# Patient Record
Sex: Female | Born: 1965 | Hispanic: Yes | Marital: Married
Health system: Southern US, Community
[De-identification: ages and names within clinical notes are randomized; demographics above are authoritative.]

## PROBLEM LIST (undated history)

## (undated) DIAGNOSIS — R569 Unspecified convulsions: Secondary | ICD-10-CM

## (undated) HISTORY — DX: Unspecified convulsions: R56.9

## (undated) HISTORY — PX: BREAST BIOPSY: SHX20

## (undated) HISTORY — PX: BRAIN SURGERY: SHX531

---

## 2008-03-30 ENCOUNTER — Ambulatory Visit: Payer: Self-pay

## 2010-01-11 ENCOUNTER — Ambulatory Visit: Payer: Self-pay | Admitting: Family Medicine

## 2010-01-25 ENCOUNTER — Ambulatory Visit: Payer: Self-pay | Admitting: Family Medicine

## 2010-02-01 ENCOUNTER — Ambulatory Visit: Payer: Self-pay

## 2010-08-10 ENCOUNTER — Ambulatory Visit: Payer: Self-pay

## 2011-01-17 ENCOUNTER — Ambulatory Visit: Payer: Self-pay

## 2011-02-22 ENCOUNTER — Ambulatory Visit: Payer: Self-pay

## 2012-10-29 ENCOUNTER — Ambulatory Visit: Payer: Self-pay

## 2014-05-31 ENCOUNTER — Ambulatory Visit: Payer: Self-pay

## 2014-08-09 ENCOUNTER — Emergency Department: Payer: Self-pay | Admitting: Emergency Medicine

## 2018-04-23 ENCOUNTER — Ambulatory Visit: Payer: Self-pay

## 2018-07-23 ENCOUNTER — Ambulatory Visit: Payer: Self-pay | Attending: Oncology

## 2018-07-23 ENCOUNTER — Other Ambulatory Visit: Payer: Self-pay

## 2018-07-23 ENCOUNTER — Ambulatory Visit
Admission: RE | Admit: 2018-07-23 | Discharge: 2018-07-23 | Disposition: A | Discharge status: Home / Self Care | Payer: Self-pay | Source: Ambulatory Visit | Attending: Oncology | Admitting: Oncology

## 2018-07-23 ENCOUNTER — Encounter (INDEPENDENT_AMBULATORY_CARE_PROVIDER_SITE_OTHER): Payer: Self-pay

## 2018-07-23 VITALS — BP 147/87 | HR 73 | Temp 98.2°F | Ht <= 58 in | Wt 137.0 lb

## 2018-07-23 DIAGNOSIS — Z0000 Encounter for general adult medical examination without abnormal findings: Secondary | ICD-10-CM | POA: Insufficient documentation

## 2018-07-23 NOTE — Progress Notes (Signed)
  Subjective:     Patient ID: Lindsay Dunlap, female   DOB: [birth year], 53 y.o.   MRN: [id number]  HPI   Review of Systems     Objective:   Physical Exam Chest:     Breasts:        Right: No swelling, bleeding, inverted nipple, mass, nipple discharge, skin change or tenderness.        Left: No swelling, bleeding, inverted nipple, mass, nipple discharge, skin change or tenderness.        Assessment:     53 year old patient presents for [facility] clinic visit. Lindsay Dunlap interpreted exam.  Patient screened, and meets B[facility]CP eligibility.  Patient does not have insurance, Medicare or Medicaid.  Handout given on Affordable Care Act.  Instructed patient on breast self awareness using teach back method.  Clinical breast exam unremarkable.  No mass or lump palpated.  Patient works at [employer].  She is experiencing bilateral numbness and tingling in her hands.  She is following up with her primary provider after her mammogram today.    Plan:     Sent for bilateral screening mammogram.

## 2018-07-30 NOTE — Progress Notes (Signed)
Letter mailed from [facility] to notify of normal mammogram results.  Patient to return in one year for annual screening.  Copy to [facility]. 

## 2020-01-31 ENCOUNTER — Emergency Department
Admission: EM | Admit: 2020-01-31 | Discharge: 2020-02-01 | Disposition: A | Discharge status: Other Acute Inpatient Hospital | Payer: Self-pay | Attending: Emergency Medicine | Admitting: Emergency Medicine

## 2020-01-31 ENCOUNTER — Emergency Department: Payer: Self-pay

## 2020-01-31 ENCOUNTER — Other Ambulatory Visit: Payer: Self-pay

## 2020-01-31 ENCOUNTER — Ambulatory Visit (HOSPITAL_COMMUNITY)
Admission: AD | Admit: 2020-01-31 | Discharge: 2020-01-31 | Disposition: A | Discharge status: Home / Self Care | Payer: Self-pay | Source: Other Acute Inpatient Hospital | Attending: Emergency Medicine | Admitting: Emergency Medicine

## 2020-01-31 ENCOUNTER — Encounter: Payer: Self-pay | Admitting: Emergency Medicine

## 2020-01-31 DIAGNOSIS — G40901 Epilepsy, unspecified, not intractable, with status epilepticus: Secondary | ICD-10-CM | POA: Insufficient documentation

## 2020-01-31 DIAGNOSIS — Z20822 Contact with and (suspected) exposure to covid-19: Secondary | ICD-10-CM | POA: Insufficient documentation

## 2020-01-31 LAB — COMPREHENSIVE METABOLIC PANEL
ALT: 44 U/L (ref 0–44)
AST: 77 U/L — ABNORMAL HIGH (ref 15–41)
Albumin: 3.9 g/dL (ref 3.5–5.0)
Alkaline Phosphatase: 317 U/L — ABNORMAL HIGH (ref 38–126)
Anion gap: 11 (ref 5–15)
BUN: 10 mg/dL (ref 6–20)
CO2: 26 mmol/L (ref 22–32)
Calcium: 8.8 mg/dL — ABNORMAL LOW (ref 8.9–10.3)
Chloride: 102 mmol/L (ref 98–111)
Creatinine, Ser: 0.58 mg/dL (ref 0.44–1.00)
GFR calc Af Amer: >60 mL/min (ref >60)
GFR calc non Af Amer: >60 mL/min (ref >60)
Glucose, Bld: 144 mg/dL — ABNORMAL HIGH (ref 70–99)
Potassium: 4.1 mmol/L (ref 3.5–5.1)
Sodium: 139 mmol/L (ref 135–145)
Total Bilirubin: 0.6 mg/dL (ref 0.3–1.2)
Total Protein: 8.4 g/dL — ABNORMAL HIGH (ref 6.5–8.1)

## 2020-01-31 LAB — CBC WITH DIFFERENTIAL/PLATELET
Abs Immature Granulocytes: 0.06 10*3/uL (ref 0.00–0.07)
Basophils Absolute: 0.1 10*3/uL (ref 0.0–0.1)
Basophils Relative: 1 %
Eosinophils Absolute: 0 10*3/uL (ref 0.0–0.5)
Eosinophils Relative: 0 %
HCT: 39.4 % (ref 36.0–46.0)
Hemoglobin: 12.9 g/dL (ref 12.0–15.0)
Immature Granulocytes: 0 %
Lymphocytes Relative: 9 %
Lymphs Abs: 1.3 10*3/uL (ref 0.7–4.0)
MCH: 26.2 pg (ref 26.0–34.0)
MCHC: 32.7 g/dL (ref 30.0–36.0)
MCV: 79.9 fL — ABNORMAL LOW (ref 80.0–100.0)
Monocytes Absolute: 0.7 10*3/uL (ref 0.1–1.0)
Monocytes Relative: 5 %
Neutro Abs: 12.3 10*3/uL — ABNORMAL HIGH (ref 1.7–7.7)
Neutrophils Relative %: 85 %
Platelets: 320 10*3/uL (ref 150–400)
RBC: 4.93 MIL/uL (ref 3.87–5.11)
RDW: 16 % — ABNORMAL HIGH (ref 11.5–15.5)
WBC: 14.6 10*3/uL — ABNORMAL HIGH (ref 4.0–10.5)
nRBC: 0 % (ref 0.0–0.2)

## 2020-01-31 LAB — BLOOD GAS, VENOUS
Acid-base deficit: 2.7 mmol/L — ABNORMAL HIGH (ref 0.0–2.0)
Bicarbonate: 22.6 mmol/L (ref 20.0–28.0)
FIO2: 0.28
MECHVT: 450 mL
Mechanical Rate: 16
O2 Saturation: 93.3 %
PEEP: 5 cmH2O
Patient temperature: 37
RATE: 16 resp/min
pCO2, Ven: 40 mmHg — ABNORMAL LOW (ref 44.0–60.0)
pH, Ven: 7.36 (ref 7.250–7.430)
pO2, Ven: 71 mmHg — ABNORMAL HIGH (ref 32.0–45.0)

## 2020-01-31 LAB — BLOOD GAS, ARTERIAL
Acid-Base Excess: 0.4 mmol/L (ref 0.0–2.0)
Bicarbonate: 26 mmol/L (ref 20.0–28.0)
FIO2: 0.28
MECHVT: 450 mL
O2 Saturation: 94.8 %
PEEP: 5 cmH2O
Patient temperature: 37
RATE: 16 resp/min
pCO2 arterial: 45 mmHg (ref 32.0–48.0)
pH, Arterial: 7.37 (ref 7.350–7.450)
pO2, Arterial: 77 mmHg — ABNORMAL LOW (ref 83.0–108.0)

## 2020-01-31 LAB — URINALYSIS, COMPLETE (UACMP) WITH MICROSCOPIC
Bilirubin Urine: NEGATIVE
Glucose, UA: NEGATIVE mg/dL
Hgb urine dipstick: NEGATIVE
Ketones, ur: NEGATIVE mg/dL
Leukocytes,Ua: NEGATIVE
Nitrite: POSITIVE — AB
Protein, ur: 100 mg/dL — AB
Specific Gravity, Urine: 1.023 (ref 1.005–1.030)
pH: 6 (ref 5.0–8.0)

## 2020-01-31 LAB — PREGNANCY, URINE: Preg Test, Ur: NEGATIVE

## 2020-01-31 LAB — SARS CORONAVIRUS 2 BY RT PCR (HOSPITAL ORDER, PERFORMED IN ~~LOC~~ HOSPITAL LAB): SARS Coronavirus 2: NEGATIVE

## 2020-01-31 MED ORDER — NOREPINEPHRINE 4 MG/250ML-% IV SOLN
0.0000 ug/min | INTRAVENOUS | Status: DC
  Filled 2020-01-31: qty 250

## 2020-01-31 MED ORDER — PROPOFOL 1000 MG/100ML IV EMUL
5.0000 ug/kg/min | INTRAVENOUS | Status: DC
  Administered 2020-01-31: 10 ug/kg/min via INTRAVENOUS

## 2020-01-31 MED ORDER — LORAZEPAM 2 MG/ML IJ SOLN
INTRAMUSCULAR | Status: AC
  Filled 2020-01-31: qty 2

## 2020-01-31 MED ORDER — LORAZEPAM 2 MG/ML IJ SOLN
INTRAMUSCULAR | Status: AC
  Administered 2020-01-31: 2 mg via INTRAVENOUS
  Filled 2020-01-31: qty 1

## 2020-01-31 MED ORDER — SODIUM CHLORIDE 0.9 % IV SOLN
1250.0000 mg | Freq: Once | INTRAVENOUS | Status: AC
  Administered 2020-01-31: 1250 mg via INTRAVENOUS
  Filled 2020-01-31: qty 25

## 2020-01-31 MED ORDER — LORAZEPAM 2 MG/ML IJ SOLN: 2.0000 mg | Freq: Once | INTRAMUSCULAR | Status: AC

## 2020-01-31 MED ORDER — LORAZEPAM 2 MG/ML IJ SOLN
4.0000 mg | Freq: Once | INTRAMUSCULAR | Status: AC
  Administered 2020-01-31: 4 mg via INTRAVENOUS

## 2020-01-31 MED ORDER — HYDROMORPHONE HCL 1 MG/ML IJ SOLN
INTRAMUSCULAR | Status: AC
Start: 2020-01-31 — End: 2020-01-31
  Administered 2020-01-31: 1 mg via INTRAVENOUS
  Filled 2020-01-31: qty 1

## 2020-01-31 MED ORDER — HYDROMORPHONE HCL 1 MG/ML IJ SOLN: 1.0000 mg | Freq: Once | INTRAMUSCULAR | Status: AC

## 2020-01-31 MED ORDER — MIDAZOLAM HCL 50 MG/10ML IJ SOLN
2.0000 mg/h | INTRAVENOUS | Status: DC
  Administered 2020-01-31: 2 mg/h via INTRAVENOUS
  Filled 2020-01-31: qty 10

## 2020-01-31 MED ORDER — SODIUM CHLORIDE 0.9 % IV SOLN
1.0000 g | Freq: Once | INTRAVENOUS | Status: AC
  Administered 2020-01-31: 1 g via INTRAVENOUS
  Filled 2020-01-31: qty 10

## 2020-01-31 MED ORDER — NOREPINEPHRINE 4 MG/250ML-% IV SOLN
INTRAVENOUS | Status: AC
  Administered 2020-01-31: 20 ug/min via INTRAVENOUS
  Filled 2020-01-31: qty 250

## 2020-01-31 MED ORDER — LEVETIRACETAM IN NACL 1500 MG/100ML IV SOLN
1500.0000 mg | Freq: Once | INTRAVENOUS | Status: AC
  Administered 2020-01-31: 1500 mg via INTRAVENOUS
  Filled 2020-01-31: qty 100

## 2020-01-31 MED ORDER — LEVETIRACETAM IN NACL 1000 MG/100ML IV SOLN
1000.0000 mg | Freq: Once | INTRAVENOUS | Status: AC
  Administered 2020-01-31: 1000 mg via INTRAVENOUS
  Filled 2020-01-31: qty 100

## 2020-01-31 MED ORDER — SODIUM CHLORIDE 0.9 % IV BOLUS
1000.0000 mL | Freq: Once | INTRAVENOUS | Status: AC
  Administered 2020-01-31: 1000 mL via INTRAVENOUS

## 2020-01-31 MED ORDER — FENTANYL 2500MCG IN NS 250ML (10MCG/ML) PREMIX INFUSION
0.0000 ug/h | INTRAVENOUS | Status: DC
  Administered 2020-01-31: 100 ug/h via INTRAVENOUS
  Filled 2020-01-31: qty 250

## 2020-01-31 MED ORDER — LEVETIRACETAM IN NACL 1000 MG/100ML IV SOLN: 1000.0000 mg | Freq: Two times a day (BID) | INTRAVENOUS | Status: DC

## 2020-01-31 MED ORDER — SODIUM CHLORIDE 0.9 % IV SOLN: 1000.0000 mg | Freq: Once | INTRAVENOUS | Status: DC

## 2020-01-31 NOTE — ED Notes (Signed)
Warm blankets applied to pt at this time.

## 2020-01-31 NOTE — ED Provider Notes (Addendum)
[facility] Emergency Department Provider Note  ____________________________________________  Time seen: Approximately 4:09 PM  I have reviewed the triage vital signs and the nursing notes.   HISTORY  Chief Complaint Seizures    Level 5 Caveat: Portions of the History and Physical including HPI and review of systems are unable to be completely obtained due to patient acute critical illness and unresponsiveness  HPI Lindsay Dunlap is a 54 y.o. female with a history of seizures and brain surgery at [facility] 3 months ago who was brought to the ED due to  seizures that started about 3:00 PM today.  EMS report patient was seizing when they arrived on scene.  They given a total of 8 mg of intramuscular Versed without any improvement.  By family report, the patient has run out of her seizure meds recently.  No new trauma or acute illness or other reported symptoms.  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     Recent neuro note from [facility] in EMR: Lindsay Dunlap is a 54 y.o. 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  Seizure: Single event lasting ~15 minutes with BUE stiffening and shaking with gradual return to baseline mental status. No tongue biting. No urination. Had a CPA schwannoma resected in October 2020 via neurosurgery with a R frontal encephalomalacia and gliosis from a catheter, which is the most likely seizure focus. PNES is also possible, but with encephalomalacia and concerning description, she warrants continued treatment. She is tolerating the medication well. She is endorsing some mood instability, but it is unclear if it is due to the stress and aftermath of the surgery.   Plan/Recommendations: - Continue Keppra 750 mg BID - Discussed  importance of compliance and seizure precautions - Provided referral to psychiatry per patient and husband's request - Follow up in 1 year or PRN     Past Medical History:  Diagnosis Date  . Seizures (HCC)      There are no problems to display for this patient.    Past Surgical History:  Procedure Laterality Date  . BRAIN SURGERY    . BREAST BIOPSY Right    core- neg     Prior to Admission medications   Medication Sig Start Date End Date Taking? Authorizing Provider  levETIRAcetam (KEPPRA) 750 MG tablet Take 750 mg by mouth 2 (two) times daily. 12/09/19  Yes [provider]     Allergies Patient has no known allergies.   Family History  Problem Relation Age of Onset  . Breast cancer Mother 77    Social History Social History   Tobacco Use  . Smoking status: Not on file  Substance Use Topics  . Alcohol use: Not on file  . Drug use: Not on file    Review of Systems Level 5 Caveat: Portions of the History and Physical including HPI and review of systems are unable to be completely obtained due to patient being a poor historian   Constitutional:   No known fever.  ENT:   No rhinorrhea. Cardiovascular:   No chest pain or syncope. Respiratory:   No dyspnea or cough. Gastrointestinal:   Negative for abdominal pain, vomiting and diarrhea.  Musculoskeletal:   Negative for focal pain or swelling ____________________________________________   PHYSICAL EXAM:  VITAL SIGNS: ED Triage Vitals  Enc Vitals Group     BP 01/31/20 1549 121/76     Pulse Rate 01/31/20 1547 (!) 114     Resp 01/31/20 1547 13     Temp --      Temp src --      SpO2 01/31/20 1547 100 %     Weight --      Height --      Head Circumference --      Peak Flow --      Pain Score --      Pain Loc --      Pain Edu? --      Excl. in GC? --     Vital signs reviewed, nursing assessments reviewed.   Constitutional:   Comatose, seizing, gaze fixed straightahead with left body  clonus Eyes:   Conjunctivae are normal.  Pupils equal at about 3 mm, nonreactive ENT      Head:   Normocephalic and atraumatic.      Nose:   No congestion/rhinnorhea.  No epistaxis      Mouth/Throat:   Dry mucous membranes.  Dried blood in the mouth from tongue abrasion..       Neck:   No meningismus. Full ROM.  No midline step-off Hematological/Lymphatic/Immunilogical:   No cervical lymphadenopathy. Cardiovascular:   RRR. Symmetric bilateral radial and DP pulses.  No murmurs. Cap refill less than 2 seconds. Respiratory:   Shallow respirations.  Nonrebreather in place from EMS. Gastrointestinal:   Soft and nontender. Non distended. There is no CVA tenderness.  No rebound, rigidity, or guarding.  Nongravid Musculoskeletal:   Normal range of motion in all extremities.  No deformities. Neurologic:   Nonverbal, not interactive.  Withdraws to pain on right upper extremity and right lower extremity, no response in left side.  GCS = E1V1M4 = 6  Skin:    Skin is warm, dry and intact. No rash noted.  No petechiae, purpura, or bullae.  ____________________________________________    LABS (pertinent positives/negatives) (all labs ordered are listed, but only abnormal results are displayed) Labs Reviewed  CBC WITH DIFFERENTIAL/PLATELET - Abnormal; Notable for the following components:      Result Value   WBC 14.6 (*)    MCV 79.9 (*)    RDW 16.0 (*)    Neutro Abs 12.3 (*)    All other components within normal limits  COMPREHENSIVE METABOLIC PANEL - Abnormal; Notable for the following components:   Glucose, Bld 144 (*)    Calcium 8.8 (*)    Total Protein 8.4 (*)    AST 77 (*)    Alkaline Phosphatase 317 (*)    All other components within normal limits  URINALYSIS, COMPLETE (UACMP) WITH MICROSCOPIC - Abnormal; Notable for the following components:   Color, Urine AMBER (*)    APPearance HAZY (*)    Protein, ur 100 (*)    Nitrite POSITIVE (*)    Bacteria, UA MANY (*)    All other  components within normal limits  BLOOD GAS, ARTERIAL - Abnormal; Notable for the following components:   pO2, Arterial 77 (*)    All other components within normal limits  SARS CORONAVIRUS 2 BY RT PCR (HOSPITAL ORDER, PERFORMED IN [facility])  URINE CULTURE  PREGNANCY, URINE   ____________________________________________   EKG  Interpreted by me Sinus tachycardia rate 115, right axis, normal intervals.  Normal QRS ST segments and T waves.  No ischemic changes or evidence of right heart strain.  ____________________________________________  RADIOLOGY  CT HEAD WO CONTRAST  Result Date: 01/31/2020 CLINICAL DATA:  Cerebral hemorrhage suspected; seizure, nontraumatic. Additional provided: Patient with history of brain surgery 3 months ago. EXAM: CT HEAD WITHOUT CONTRAST TECHNIQUE: Contiguous axial images were obtained from the base of the skull through the vertex without intravenous contrast. COMPARISON:  No pertinent prior exams are available for comparison. FINDINGS: Brain: Sequela of prior right posterior fossa craniectomy with encephalomalacia within the underlying right cerebellar hemisphere. 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 There is also sequela of prior right frontoparietal craniotomy/cranioplasty with encephalomalacia within the underlying right frontal lobe. There is no acute intracranial hemorrhage. No acute demarcated cortical infarct. No extra-axial fluid collection. No midline shift. Vascular: No hyperdense vessel. Skull: Sequela of prior right posterior fossa craniectomy and right frontoparietal craniectomy/cranioplasty. No calvarial fracture. Sinuses/Orbits: Visualized orbits show no acute finding. Mild ethmoid and maxillary sinus mucosal thickening at the imaged levels. No significant mastoid effusion IMPRESSION: No evidence of acute intracranial abnormality. Sequela of prior right posterior fossa craniectomy  with encephalomalacia in the underlying right cerebellar hemisphere. 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 Sequela of prior right frontoparietal craniotomy/cranioplasty with a small focus of encephalomalacia within the underlying right frontal lobe. Mild ethmoid and maxillary sinus mucosal thickening at the imaged levels. Electronically Signed   By: Jackey Loge DO   On: 01/31/2020 16:44   DG Chest Portable 1 View  Result Date: 01/31/2020 CLINICAL DATA:  Intubation and OG tube placement, seizures EXAM: PORTABLE CHEST 1 VIEW COMPARISON:  None FINDINGS: Endotracheal tube is positioned low within the trachea, nearly approximating the level of the carina. Recommend retraction approximately 3-4 cm to the mid trachea. A transesophageal tube is seen coiling in the left upper quadrant prior crossing midline, terminating near the region of the gastric antrum with the side port distal to the GE junction. Telemetry leads overlie the chest. Streaky and linear opacities in the lungs favor atelectasis given some low lung volumes. No pneumothorax or visible effusion. The cardiomediastinal contours are unremarkable. No acute osseous or soft tissue abnormality. IMPRESSION: Endotracheal tube is positioned low within the trachea, nearly approximating the level of the carina. Recommend retraction approximately 3-4 cm to the mid trachea. Transesophageal tube terminates near the level of the gastric antrum with the side port appropriately positioned beyond the GE junction. Streaky opacities in the lungs with low volumes, favor atelectasis. Electronically Signed   By: Kreg Shropshire M.D.   On: 01/31/2020 16:53    ____________________________________________   PROCEDURES .Critical Care Performed by: Sharman Cheek, MD Authorized by: Sharman Cheek, MD   Critical care provider statement:    Critical care time (minutes):  40   Critical care time was exclusive of:   Separately billable procedures and treating other patients   Critical care was necessary to treat or prevent imminent or life-threatening deterioration of the following conditions:  CNS failure or compromise   Critical care was time spent personally by me on the following activities:  Development of treatment plan with patient or surrogate, discussions with consultants, evaluation of patient's response to treatment, examination of patient, obtaining history from patient or surrogate, ordering and performing treatments and interventions, ordering and review of laboratory studies, ordering and review of radiographic studies, pulse oximetry, re-evaluation of patient's condition and review of old charts Comments:        Procedure Name: Intubation Date/Time: 01/31/2020 4:41  PM Performed by: Sharman Cheek, MD Pre-anesthesia Checklist: Patient identified, Patient being monitored, Emergency Drugs available, Timeout performed and Suction available Oxygen Delivery Method: Non-rebreather mask Preoxygenation: Pre-oxygenation with 100% oxygen Induction Type: Rapid sequence Ventilation: Mask ventilation without difficulty Laryngoscope Size: Glidescope and 3 Grade View: Grade I Tube type: Subglottic suction tube Tube size: 7.5 mm Number of attempts: 1 Placement Confirmation: ETT inserted through vocal cords under direct vision,  CO2 detector and Breath sounds checked- equal and bilateral Secured at: 21 cm Tube secured with: Tape Dental Injury: Teeth and Oropharynx as per pre-operative assessment  Comments:       OG placement  Date/Time: 01/31/2020 4:42 PM Performed by: Sharman Cheek, MD Authorized by: Sharman Cheek, MD  Local anesthesia used: no  Anesthesia: Local anesthesia used: no  Sedation: Patient sedated: no  Patient tolerance: patient tolerated the procedure well with no immediate complications Comments: Placed after intubation    .1-3 Lead EKG  Interpretation Performed by: Sharman Cheek, MD Authorized by: Sharman Cheek, MD     Interpretation: abnormal     ECG rate:  110   ECG rate assessment: tachycardic     Rhythm: sinus tachycardia     Ectopy: none     Conduction: normal      ____________________________________________  DIFFERENTIAL DIAGNOSIS   Intracranial hemorrhage, intracranial cyst/mass-effect, herniation, status epilepticus from medication withdrawal, electrolyte abnormality, pregnancy  CLINICAL IMPRESSION / ASSESSMENT AND PLAN / ED COURSE  Medications ordered in the ED: Medications  propofol (DIPRIVAN) 1000 MG/100ML infusion (0 mcg/kg/min  67.1 kg Intravenous Stopped 01/31/20 1834)  midazolam (VERSED) 50 mg in dextrose 5 % 50 mL (1 mg/mL) infusion (2 mg/hr Intravenous Rate/Dose Verify 01/31/20 1853)  fentaNYL in NS (30mcg/ml) infusion-PREMIX (100 mcg/hr Intravenous Rate/Dose Verify 01/31/20 1853)  norepinephrine (LEVOPHED) 4mg  in premix infusion (20 mcg/min Intravenous Rate/Dose Verify 01/31/20 1853)  levETIRAcetam (KEPPRA) IVPB 1000 mg/100 mL premix (has no administration in time range)  levETIRAcetam (KEPPRA) IVPB 1000 mg/100 mL premix (has no administration in time range)  sodium chloride 0.9 % bolus 1,000 mL (0 mLs Intravenous Stopped 01/31/20 1657)  levETIRAcetam (KEPPRA) IVPB 1500 mg/ 100 mL premix (0 mg Intravenous Stopped 01/31/20 1657)  cefTRIAXone (ROCEPHIN) 1 g in sodium chloride 0.9 % 100 mL IVPB (0 g Intravenous Stopped 01/31/20 1733)  LORazepam (ATIVAN) injection 2 mg (2 mg Intravenous Given 01/31/20 1717)  phenytoin (DILANTIN) 1,250 mg in sodium chloride 0.9 % 250 mL IVPB (0 mg Intravenous Stopped 01/31/20 1850)  LORazepam (ATIVAN) injection 4 mg (4 mg Intravenous Given 01/31/20 1730)  sodium chloride 0.9 % bolus 1,000 mL (0 mLs Intravenous Stopped 01/31/20 1951)  LORazepam (ATIVAN) injection 4 mg (4 mg Intravenous Given 01/31/20 1823)  HYDROmorphone (DILAUDID) injection 1 mg (1  mg Intravenous Given 01/31/20 1823)    Pertinent labs & imaging results that were available during my care of the patient were reviewed by me and considered in my medical decision making (see chart for details).   Lindsay Dunlap was evaluated in Emergency Department on 01/31/2020 for the symptoms described in the history of present illness. She was evaluated in the context of the global COVID-19 pandemic, which necessitated consideration that the patient might be at risk for infection with the SARS-CoV-2 virus that causes COVID-19. Institutional protocols and algorithms that pertain to the evaluation of patients at risk for COVID-19 are in a state of rapid change based on information released by regulatory bodies including the CDC and federal and state organizations.  These policies and algorithms were followed during the patient's care in the ED.     Clinical Course as of Jan 31 2131  Wynelle Link Jan 31, 2020  1607 Patient arrived in status epilepticus 45 minutes duration refractory to 8 mg of IM Versed by EMS, reported history of recent brain surgery 2 or 3 months ago and running out of antiepileptic medications.  Intubated on arrival with propofol and Versed used for RSI.  Will obtain stat CT head, chest x-ray, UA, pregnancy test, serum labs, Covid test.   [PS]  1644 Ua suggestive of UTI. Given critical illness, will give rocephin and send cx.   [PS]  1645 Cxr shows adequate position of ETT with tip 1cm above carina. OGT in good position.    [PS]  1655 CT head report reassuring, with sequelae of prior right posterior fossa craniectomy and residual meningocele. No ich or necrosis or mass.   [PS]  1722 Recurrent seizure. Propofol incredase. Ativan 2mg  given. Will order phenytoin.    [PS]  1909 [phone], [phone], alternate contact.  (son)   [PS]  1947 BP improved after transition from propofol to versed.   [PS]    Clinical Course User Index [PS] Courtney Heys, MD      ----------------------------------------- 6:21 PM on 01/31/2020 -----------------------------------------  Had a pause propofol again due to decreased blood pressure.  Will transition to fentanyl and Versed infusions for pain control and sedation due to propofol intolerance.  Case discussed with the intensivist.  Called lab to inquire about Covid swab.  Apparently it was inadvertently ordered to be run in the [facility] lab and is right now being started there.  ----------------------------------------- 9:31 PM on 01/31/2020 -----------------------------------------  Covid result is negative.  Was informed by ICU team that they feel patient would be appropriate for transfer for continuous EEG monitoring.  Discussed with [facility] neurology [facility] who accepts to the neuro ICU there.  [facility] advises they do have a bed available for the patient.    ____________________________________________   FINAL CLINICAL IMPRESSION(S) / ED DIAGNOSES    Final diagnoses:  Status epilepticus [facility])     ED Discharge Orders    None      Portions of this note were generated with dragon dictation software. Dictation errors may occur despite best attempts at proofreading.   [facility], MD 01/31/20 04/01/20, MD 01/31/20 1947    04/01/20, MD 01/31/20 2132

## 2020-01-31 NOTE — ED Notes (Signed)
Pt currently calm on vent and appears in no distress. Pt has no movement to external pain. VS WNL. Will continue to re assess

## 2020-01-31 NOTE — ED Notes (Signed)
Dr. Scotty Court inserting OG

## 2020-01-31 NOTE — ED Notes (Signed)
Pt given propofol 140 mg, Roc 70, Versed 5 mg for intubation.

## 2020-01-31 NOTE — ED Notes (Signed)
Pts propofol reduced to 10 mcg/kg/min

## 2020-01-31 NOTE — ED Notes (Signed)
RT notified of VBG sent to lab.  

## 2020-01-31 NOTE — ED Notes (Signed)
Pt given 100mcg of fentanyl.  

## 2020-01-31 NOTE — ED Notes (Signed)
Family member at bedside. Interpreter at bedside. EDP Stafford at bedside to talk to husband.

## 2020-01-31 NOTE — ED Notes (Signed)
Pt intubated by Dr. Scotty Court, 7.5 tube 21 at the teeth

## 2020-01-31 NOTE — ED Notes (Signed)
EDP and RT at bedside at bedside currently being bagged

## 2020-01-31 NOTE — ED Triage Notes (Signed)
8 versed PTA yellow IO lt tib  Seizing 30 min prior to EMS arrival 45-50 total seizure time  89% RA 130 HR  Brain surg 3 months ago off seziure meds  cbg 120

## 2020-01-31 NOTE — ED Notes (Signed)
X-ray at bedside

## 2020-01-31 NOTE — ED Notes (Signed)
Propofol started at 20 mcg/kg/min per Dr. Scotty Court

## 2020-01-31 NOTE — ED Notes (Addendum)
Pt R arm noted to be moving in twitching movtion. Upon inspection of pt eyes pt appeared to be having a seizure. Informed Dr. Scotty Court, see MAR. HR increased and BP increased.

## 2020-02-01 NOTE — ED Notes (Addendum)
[facility] transported pt and both the Versed and Fentanyl were stopped prior to ED departure. No control substance log in/out sheet was found or given to this RN. Versed & fentanyl Infusion hung by RN Binnie Rail Versed was walked to pharmacy by this RN to have wasted. Pharmacist Onalee Hua gave this RN a pink sheet to waste remaining medication and asked this RN to waste in the ED. Versed wasted by this RN with Lucina Mellow RN present as a witness.  Fentanyl was wasted by this RN and Charge RN Lea at the ED main pyxis and drugs (Fentanyl and Versed) were placed into the stericycle for waste

## 2020-02-02 LAB — URINE CULTURE: Culture: >=100000 — AB

## 2020-02-03 NOTE — Progress Notes (Signed)
ED Antimicrobial Stewardship Positive Culture Follow Up   Lindsay Dunlap is an 54 y.o. female who presented to [facility] on 01/31/2020 with a chief complaint of  Chief Complaint  Patient presents with  . Seizures    Recent Results (from the past 720 hour(s))  Urine Culture     Status: Abnormal   Collection Time: 01/31/20  3:50 PM   Specimen: Urine, Catheterized  Result Value Ref Range Status   Specimen Description   Final    URINE, CATHETERIZED Performed at [facility], [address] [place], [place] [address]    Special Requests   Final    NONE Performed at [facility], [address] [place], [place] [address]    Culture >=100,000 COLONIES/mL ESCHERICHIA COLI (A)  Final   Report Status 02/02/2020 FINAL  Final   Organism ID, Bacteria ESCHERICHIA COLI (A)  Final      Susceptibility   Escherichia coli - MIC*    AMPICILLIN <=2 SENSITIVE Sensitive     CEFAZOLIN <=4 SENSITIVE Sensitive     CEFTRIAXONE <=0.25 SENSITIVE Sensitive     CIPROFLOXACIN <=0.25 SENSITIVE Sensitive     GENTAMICIN <=1 SENSITIVE Sensitive     IMIPENEM <=0.25 SENSITIVE Sensitive     NITROFURANTOIN <=16 SENSITIVE Sensitive     TRIMETH/SULFA <=20 SENSITIVE Sensitive     AMPICILLIN/SULBACTAM <=2 SENSITIVE Sensitive     PIP/TAZO <=4 SENSITIVE Sensitive     * >=100,000 COLONIES/mL ESCHERICHIA COLI  SARS Coronavirus 2 by RT PCR (hospital order, performed in [facility] hospital lab) Nasopharyngeal Nasopharyngeal Swab     Status: None   Collection Time: 01/31/20  6:04 PM   Specimen: Nasopharyngeal Swab  Result Value Ref Range Status   SARS Coronavirus 2 NEGATIVE NEGATIVE Final    Comment: (NOTE) SARS-CoV-2 target nucleic acids are NOT DETECTED.  The SARS-CoV-2 RNA is generally detectable in upper and lower respiratory specimens during the acute phase of infection. The lowest concentration of SARS-CoV-2 viral copies this assay can detect is 250 copies / mL. A negative  result does not preclude SARS-CoV-2 infection and should not be used as the sole basis for treatment or other patient management decisions.  A negative result may occur with improper specimen collection / handling, submission of specimen other than nasopharyngeal swab, presence of viral mutation(s) within the areas targeted by this assay, and inadequate number of viral copies (<250 copies / mL). A negative result must be combined with clinical observations, patient history, and epidemiological information.  Fact Sheet for Patients:   BoilerBrush.com.cy  Fact Sheet for Healthcare Providers: https://pope.com/  This test is not yet approved or  cleared by the [place] FDA and has been authorized for detection and/or diagnosis of SARS-CoV-2 by FDA under an Emergency Use Authorization (EUA).  This EUA will remain in effect (meaning this test can be used) for the duration of the COVID-19 declaration under Section 564(b)(1) of the Act, 21 U.S.C. section 360bbb-3(b)(1), unless the authorization is terminated or revoked sooner.  Performed at [facility], [address], [place], [place] [address]    Pt transferred to [facility] - results called to pts nurse at [phone] Urine culture >100,000 colonies/mL escherichia coli - pan-sensitive   Reatha Armour, PharmD Pharmacy Resident  02/03/2020 4:17 PM

## 2020-02-04 LAB — GLUCOSE, CAPILLARY: Glucose-Capillary: 168 mg/dL — ABNORMAL HIGH (ref 70–99)

## 2021-01-20 IMAGING — CT CT HEAD W/O CM
3 of 4 series · 14 of 47 positions shown, 16 images · non-contrast
Comparison: No pertinent prior exams are available for comparison.

CLINICAL DATA: Cerebral hemorrhage suspected; seizure,
nontraumatic. Additional provided: Patient with history of brain
surgery 3 months ago.

EXAM:
CT HEAD WITHOUT CONTRAST
TECHNIQUE: Contiguous axial images were obtained from the base of the skull
through the vertex without intravenous contrast.

[Series 2: head wo · axial · 0.40mm/px · z∈[-155,-45]mm · 8 of 28 slices shown, 10 images]
[im 3/28  brain]
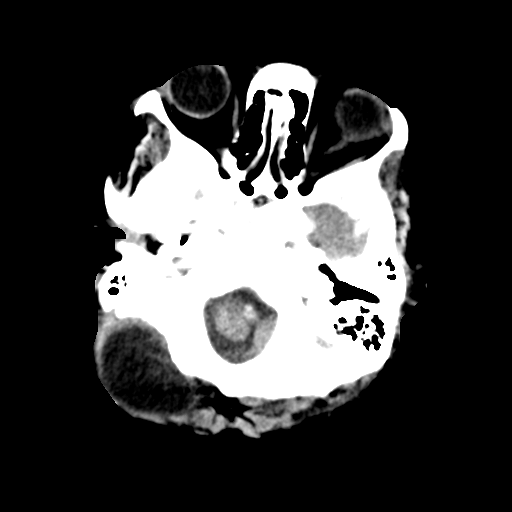
[im 3/28  bone]
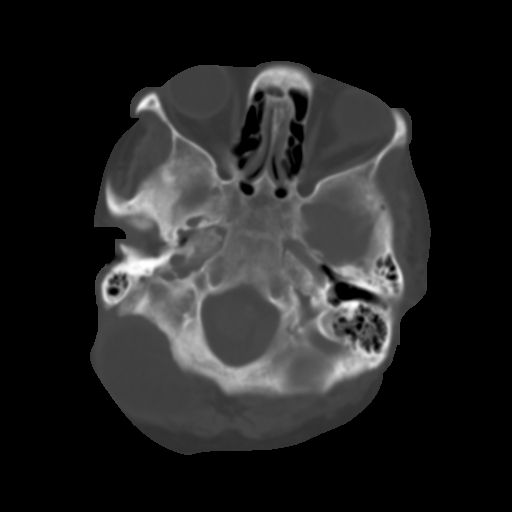
[im 6/28  brain]
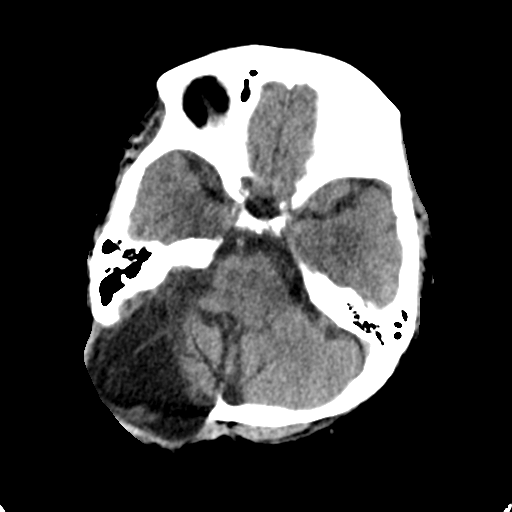
[im 10/28  brain]
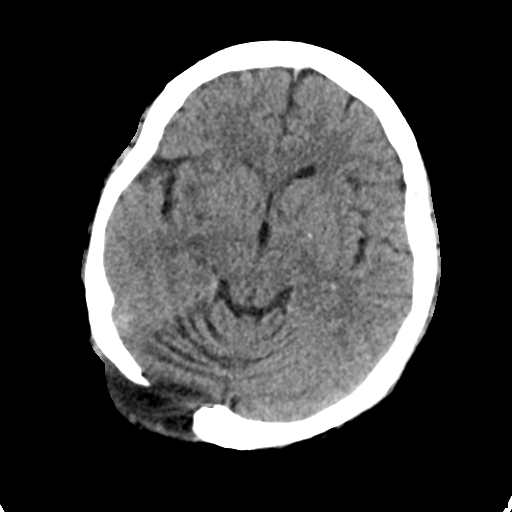
[im 12/28  brain]
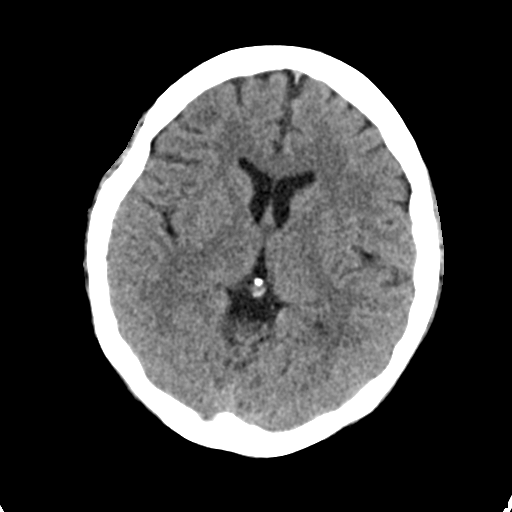
[im 16/28  brain]
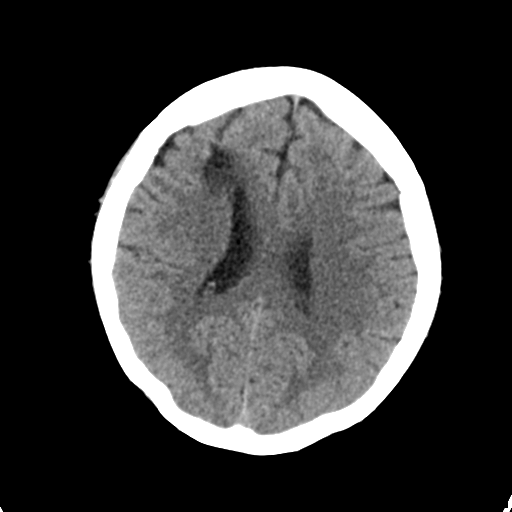
[im 16/28  bone]
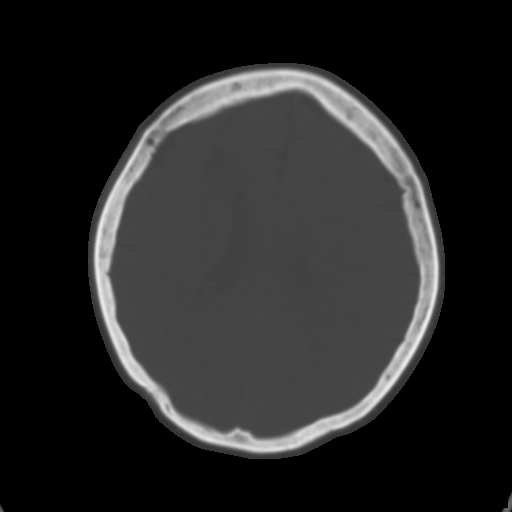
[im 19/28  brain]
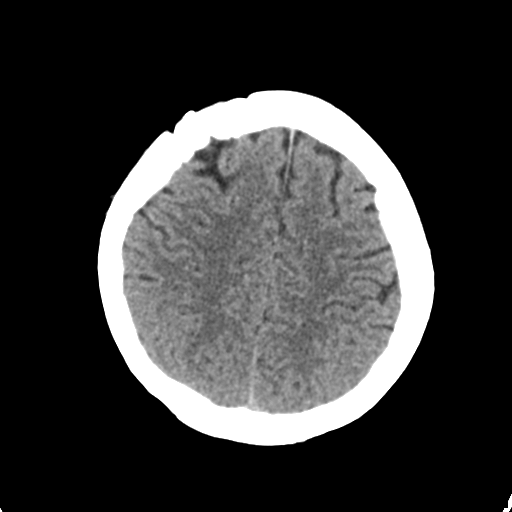
[im 22/28  brain]
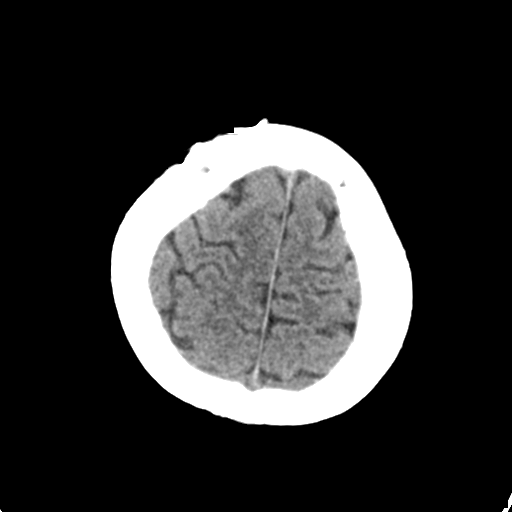
[im 25/28  brain]
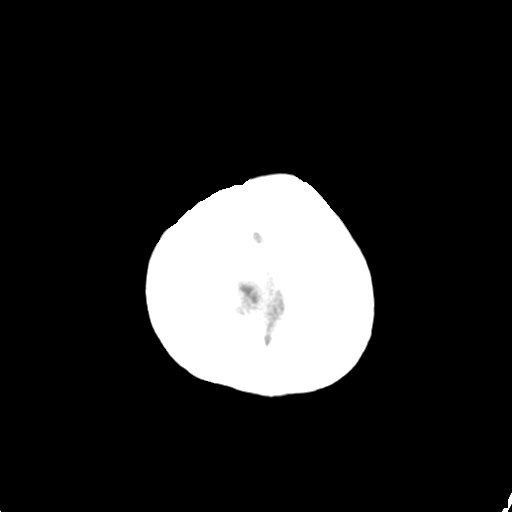

[Series 4: coronal soft tissue · coronal · 0.27mm/px · 3 of 60 slices shown]
[im 20/60  brain]
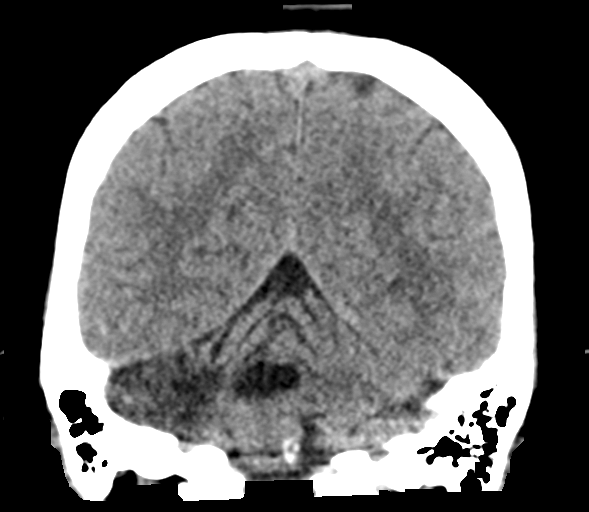
[im 27/60  brain]
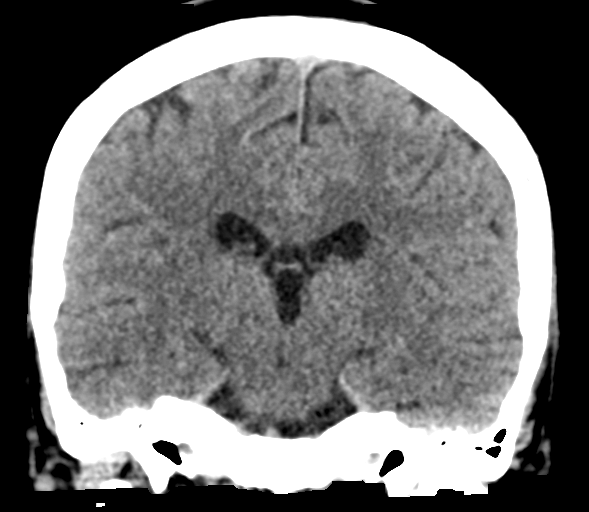
[im 33/60  brain]
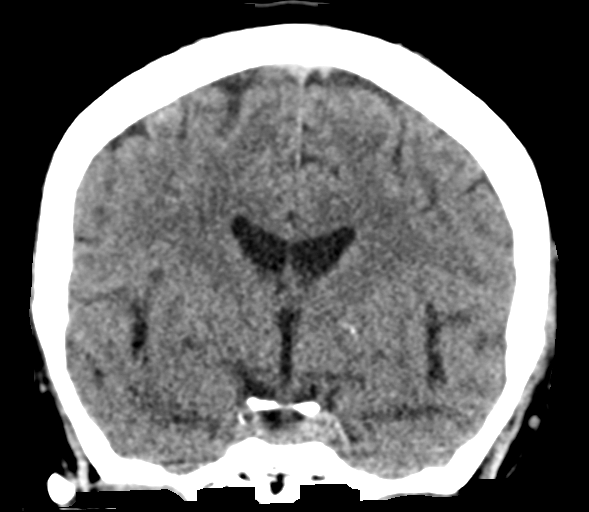

[Series 5: sagittal soft tissue · sagittal · 0.27mm/px · 3 of 54 slices shown]
[im 18/54  brain]
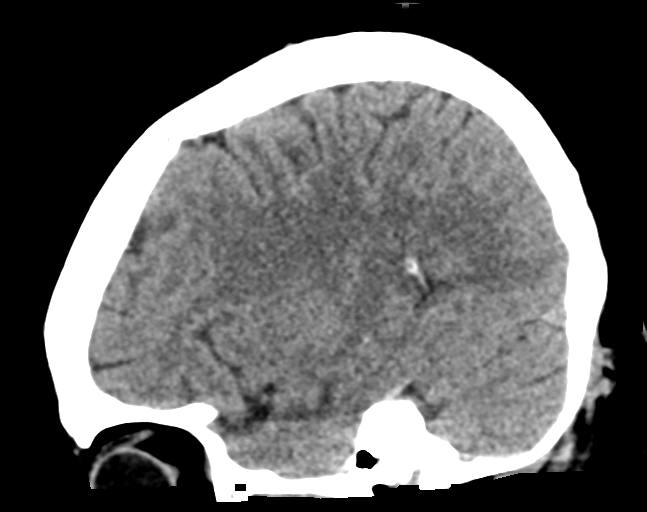
[im 27/54  brain]
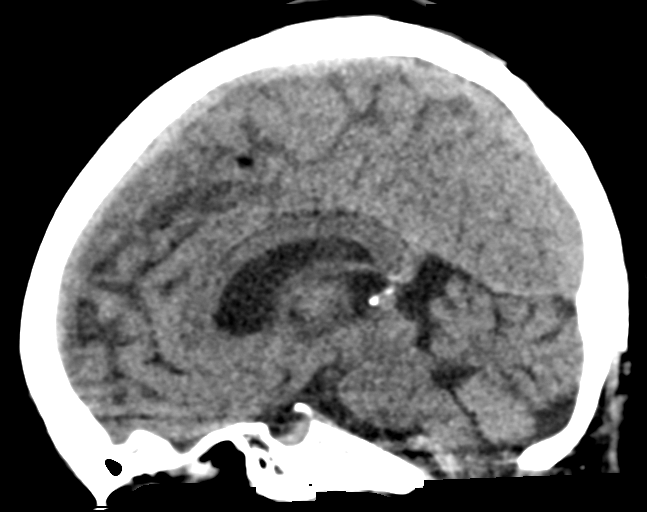
[im 36/54  brain]
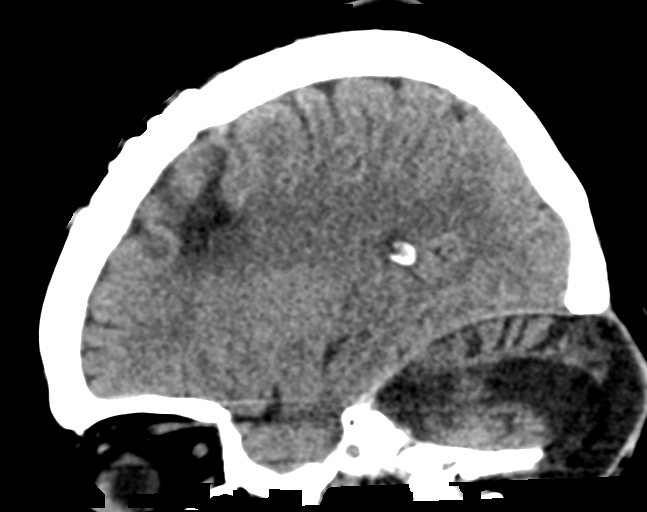

[14 of 47 positions shown; findings below may reference images not displayed]

FINDINGS: Brain:

Sequela of prior right posterior fossa craniectomy with
encephalomalacia within the underlying right cerebellar hemisphere.
Also at this site, there is a meningocele measuring 6.0 x 2.3 cm in
transaxial dimensions.

There is also sequela of prior right frontoparietal
craniotomy/cranioplasty with encephalomalacia within the underlying
right frontal lobe.

There is no acute intracranial hemorrhage.

No acute demarcated cortical infarct.

No extra-axial fluid collection.

No midline shift.

Vascular: No hyperdense vessel.

Skull: Sequela of prior right posterior fossa craniectomy and right
frontoparietal craniectomy/cranioplasty. No calvarial fracture.

Sinuses/Orbits: Visualized orbits show no acute finding. Mild
ethmoid and maxillary sinus mucosal thickening at the imaged levels.
No significant mastoid effusion
IMPRESSION: No evidence of acute intracranial abnormality.

Sequela of prior right posterior fossa craniectomy with
encephalomalacia in the underlying right cerebellar hemisphere. Also
at this site, there is a meningocele measuring 6.0 x 2.3 cm in
transaxial dimensions.

Sequela of prior right frontoparietal craniotomy/cranioplasty with a
small focus of encephalomalacia within the underlying right frontal
lobe.

Mild ethmoid and maxillary sinus mucosal thickening at the imaged
levels.

## 2021-08-18 ENCOUNTER — Emergency Department: Payer: Self-pay

## 2021-08-18 ENCOUNTER — Emergency Department
Admission: EM | Admit: 2021-08-18 | Discharge: 2021-08-18 | Disposition: A | Discharge status: Home / Self Care | Payer: Self-pay | Attending: Emergency Medicine | Admitting: Emergency Medicine

## 2021-08-18 ENCOUNTER — Other Ambulatory Visit: Payer: Self-pay

## 2021-08-18 ENCOUNTER — Encounter: Payer: Self-pay | Admitting: Emergency Medicine

## 2021-08-18 DIAGNOSIS — R569 Unspecified convulsions: Secondary | ICD-10-CM | POA: Insufficient documentation

## 2021-08-18 DIAGNOSIS — R0682 Tachypnea, not elsewhere classified: Secondary | ICD-10-CM | POA: Insufficient documentation

## 2021-08-18 LAB — CBC
HCT: 43.6 % (ref 36.0–46.0)
Hemoglobin: 13.5 g/dL (ref 12.0–15.0)
MCH: 26.4 pg (ref 26.0–34.0)
MCHC: 31 g/dL (ref 30.0–36.0)
MCV: 85.2 fL (ref 80.0–100.0)
Platelets: 253 10*3/uL (ref 150–400)
RBC: 5.12 MIL/uL — ABNORMAL HIGH (ref 3.87–5.11)
RDW: 14.6 % (ref 11.5–15.5)
WBC: 8.9 10*3/uL (ref 4.0–10.5)
nRBC: 0 % (ref 0.0–0.2)

## 2021-08-18 LAB — COMPREHENSIVE METABOLIC PANEL
ALT: 27 U/L (ref 0–44)
AST: 46 U/L — ABNORMAL HIGH (ref 15–41)
Albumin: 4.1 g/dL (ref 3.5–5.0)
Alkaline Phosphatase: 331 U/L — ABNORMAL HIGH (ref 38–126)
Anion gap: 13 (ref 5–15)
BUN: 13 mg/dL (ref 6–20)
CO2: 26 mmol/L (ref 22–32)
Calcium: 9 mg/dL (ref 8.9–10.3)
Chloride: 101 mmol/L (ref 98–111)
Creatinine, Ser: 0.64 mg/dL (ref 0.44–1.00)
GFR, Estimated: >60 mL/min (ref >60)
Glucose, Bld: 108 mg/dL — ABNORMAL HIGH (ref 70–99)
Potassium: 4 mmol/L (ref 3.5–5.1)
Sodium: 140 mmol/L (ref 135–145)
Total Bilirubin: 0.5 mg/dL (ref 0.3–1.2)
Total Protein: 8.5 g/dL — ABNORMAL HIGH (ref 6.5–8.1)

## 2021-08-18 LAB — URINALYSIS, COMPLETE (UACMP) WITH MICROSCOPIC
Bilirubin Urine: NEGATIVE
Glucose, UA: NEGATIVE mg/dL
Hgb urine dipstick: NEGATIVE
Ketones, ur: NEGATIVE mg/dL
Leukocytes,Ua: NEGATIVE
Nitrite: POSITIVE — AB
Protein, ur: NEGATIVE mg/dL
Specific Gravity, Urine: 1.01 (ref 1.005–1.030)
pH: 7 (ref 5.0–8.0)

## 2021-08-18 LAB — CBG MONITORING, ED: Glucose-Capillary: 110 mg/dL — ABNORMAL HIGH (ref 70–99)

## 2021-08-18 MED ORDER — LORAZEPAM 2 MG/ML IJ SOLN
INTRAMUSCULAR | Status: AC
  Administered 2021-08-18: 2 mg via INTRAVENOUS
  Filled 2021-08-18: qty 1

## 2021-08-18 MED ORDER — LEVETIRACETAM IN NACL 1500 MG/100ML IV SOLN
1500.0000 mg | Freq: Once | INTRAVENOUS | Status: AC
  Administered 2021-08-18: 1500 mg via INTRAVENOUS
  Filled 2021-08-18: qty 100

## 2021-08-18 MED ORDER — LEVETIRACETAM 1000 MG PO TABS: 1000.0000 mg | ORAL_TABLET | Freq: Two times a day (BID) | ORAL | 2 refills | Status: AC

## 2021-08-18 MED ORDER — LORAZEPAM 2 MG/ML IJ SOLN: 2.0000 mg | Freq: Once | INTRAMUSCULAR | Status: AC

## 2021-08-18 MED ORDER — FOSFOMYCIN TROMETHAMINE 3 G PO PACK
3.0000 g | PACK | Freq: Once | ORAL | Status: AC
  Administered 2021-08-18: 3 g via ORAL
  Filled 2021-08-18: qty 3

## 2021-08-18 NOTE — ED Provider Notes (Signed)
? ?[facility] ?Provider Note ? ? ? Event Date/Time  ? First MD Initiated Contact with Patient 08/18/21 1411   ?  (approximate) ? ?History  ? ?Chief Complaint: Seizure ?HPI ? ?Lindsay Dunlap is a 56 y.o. 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  According EMS they are coming from [employer] where the patient was found to be seizing.  They gave the patient 2 mg of intramuscular Versed and brought the patient to the emergency department.  Upon arrival to the emergency department patient began seizing once again with left facial twitching, unresponsive to questions.  Patient was given 2 mg of IV Ativan upon arrival.  I reviewed the patient's most recent neurology note which was from approximately 1 week ago at [facility] 08/09/2021 at that time patient was on Keppra 750 twice daily and they added Lamictal.  Shortly after Ativan patient stopped seizing. ? ?Physical Exam  ? ?Triage Vital Signs: ?ED Triage Vitals  ?Enc Vitals Group  ?   BP   ?   Pulse   ?   Resp   ?   Temp   ?   Temp src   ?   SpO2   ?   Weight   ?   Height   ?   Head Circumference   ?   Peak Flow   ?   Pain Score   ?   Pain Loc   ?   Pain Edu?   ?   Excl. in GC?   ? ? ?Most recent vital signs: ?There were no vitals filed for this visit. ? ?General: Eyes are open.  Patient has rhythmic left facial twitching, unresponsive to stimuli.  Not answering questions or following commands.  Appears to have right eye cataract, left eye appears overall normal with no significant gaze deviation. ?CV:  Patient is tachycardic around 120 bpm.  No obvious murmur. ?Resp:  Mild tachypnea.  Clear breath sounds bilaterally ?Abd:  No distention.  Soft, nontender.  No rebound or guarding. ? ? ?ED Results / Procedures / Treatments  ? ?EKG ? ?EKG viewed and interpreted by myself shows sinus tachycardia 114 bpm with a  narrow QRS, normal axis, normal intervals, no concerning ST changes. ? ?RADIOLOGY ? ?I personally reviewed the CT images appears largely unchanged from prior head CT. ?Radiology is read the CT is unchanged from prior.  No acute abnormality. ? ? ?MEDICATIONS ORDERED IN ED: ?Medications  ?LORazepam (ATIVAN) injection 2 mg (has no administration in time range)  ? ? ? ?IMPRESSION / MDM / ASSESSMENT AND PLAN / ED COURSE  ?I reviewed the triage vital signs and the nursing notes. ? ?Patient presents to the emergency department for what appears to be multiple seizures today.  Patient has a known seizure disorder.  I reviewed the patient's recent neurology note from [facility].  Patient takes Keppra and was recently started on Lamictal.  Awaiting family arrival for further history.  Patient given 2 mg of IV Ativan in the emergency department which appeared to work and stop the seizure.  We will load with 1500 mg of IV Keppra.  We will check labs obtain CT imaging while awaiting family arrival. ? ?Patient's basic labs are largely within normal limits.  CBC is normal.  Chemistry shows no significant abnormality. ? ?Patient's work-up is overall reassuring.  Labs are largely within normal  limits.  CT scan is abnormal however unchanged from prior.  No acute abnormality.  Patient remains somnolent but does awaken to voice, told me she is having a headache.  Awaiting family arrival for further history.  I spoke to Dr. Amada Jupiter of neurology who recommends increasing her Keppra from 750 mg twice daily to 1000 mg twice daily and having her follow-up with her neurologist as long as the patient returns to her normal states she would be okay for discharge home.  Patient care signed out to Dr. Derrill Kay. ? ?FINAL CLINICAL IMPRESSION(S) / ED DIAGNOSES  ? ?Seizure ? ? ?Note:  This document was prepared using Dragon voice recognition software and may include unintentional dictation errors. ?  ?Minna Antis, MD ?08/18/21 1521 ? ?

## 2021-08-18 NOTE — Discharge Instructions (Addendum)
As we discussed please increase your Keppra from 750 mg twice daily to 1000 mg twice daily by using your new prescription.  Please follow-up with your neurologist next week for recheck/reevaluation.  Return to the emergency department for any further seizure activity or any other symptom personally concerning to yourself. ?

## 2021-08-18 NOTE — ED Provider Notes (Signed)
Patient awake alert and oriented. Able to tolerate PO. Will plan on discharging with paperwork prepared by Dr. Kerman Passey.  ?  ?Nance Pear, MD ?08/18/21 2211 ? ?

## 2021-08-18 NOTE — ED Notes (Signed)
Husband going home at this time- states may call if pt is discharged/other and he will pick her up. [phone] ?

## 2021-08-18 NOTE — ED Notes (Signed)
Pt sleeping- responds to voice.  ?

## 2021-08-18 NOTE — ED Notes (Addendum)
Son states pt has history of seizures- last was about two months ago. Son reports pt has been taking medications as prescribed, medications for seizure were changed 3 weeks ago. Son states pt did not fall- was sitting down to change shoes when seizure occurred. Bite wound to tongue noted on arrival- no active bleeding at this time, airway is patent. No N/V/D, pt incontinent of urine on arrival. Pt changed into hospital gown.  ?

## 2021-08-18 NOTE — ED Notes (Signed)
Pt given mesh underwear and assisted to toilet ?

## 2021-08-18 NOTE — ED Notes (Signed)
Pt given lunchbox and water.

## 2021-08-18 NOTE — ED Triage Notes (Signed)
BIB [facility].  Patient had seizure in [place].  2 mg Versed given by EMS, seizure activity continued.  On arrival gaze to right with right facial twitching noted.  Dr. Lenard Lance at bedside. ?

## 2021-08-18 NOTE — ED Notes (Signed)
Seizure pads applied and suction hooked up ?

## 2021-08-18 NOTE — ED Notes (Signed)
Husband called- notified of upcomming discharge  ?

## 2021-08-21 LAB — LAMOTRIGINE LEVEL: Lamotrigine Lvl: 4 ug/mL (ref 2.0–20.0)
